# Patient Record
Sex: Female | Born: 1945 | Hispanic: Yes | Marital: Single | Smoking: Never smoker
Health system: Southern US, Community
[De-identification: ages and names within clinical notes are randomized; demographics above are authoritative.]

## PROBLEM LIST (undated history)

## (undated) DIAGNOSIS — I10 Essential (primary) hypertension: Secondary | ICD-10-CM

## (undated) DIAGNOSIS — E119 Type 2 diabetes mellitus without complications: Secondary | ICD-10-CM

---

## 1998-11-29 ENCOUNTER — Emergency Department (HOSPITAL_COMMUNITY): Admission: EM | Admit: 1998-11-29 | Discharge: 1998-11-29 | Payer: Self-pay | Admitting: *Deleted

## 1999-02-10 ENCOUNTER — Emergency Department (HOSPITAL_COMMUNITY): Admission: EM | Admit: 1999-02-10 | Discharge: 1999-02-10 | Payer: Self-pay | Admitting: Emergency Medicine

## 2003-12-18 ENCOUNTER — Emergency Department (HOSPITAL_COMMUNITY): Admission: EM | Admit: 2003-12-18 | Discharge: 2003-12-19 | Payer: Self-pay | Admitting: Emergency Medicine

## 2018-02-21 ENCOUNTER — Encounter (HOSPITAL_COMMUNITY): Payer: Self-pay | Admitting: Emergency Medicine

## 2018-02-21 ENCOUNTER — Emergency Department (HOSPITAL_COMMUNITY)
Admission: EM | Admit: 2018-02-21 | Discharge: 2018-02-22 | Disposition: A | Discharge status: Home / Self Care | Payer: Self-pay | Attending: Emergency Medicine | Admitting: Emergency Medicine

## 2018-02-21 ENCOUNTER — Other Ambulatory Visit: Payer: Self-pay

## 2018-02-21 ENCOUNTER — Emergency Department (HOSPITAL_COMMUNITY): Payer: Self-pay

## 2018-02-21 DIAGNOSIS — E119 Type 2 diabetes mellitus without complications: Secondary | ICD-10-CM | POA: Insufficient documentation

## 2018-02-21 DIAGNOSIS — R109 Unspecified abdominal pain: Secondary | ICD-10-CM | POA: Insufficient documentation

## 2018-02-21 DIAGNOSIS — I10 Essential (primary) hypertension: Secondary | ICD-10-CM | POA: Insufficient documentation

## 2018-02-21 DIAGNOSIS — T50905A Adverse effect of unspecified drugs, medicaments and biological substances, initial encounter: Secondary | ICD-10-CM | POA: Insufficient documentation

## 2018-02-21 DIAGNOSIS — E875 Hyperkalemia: Secondary | ICD-10-CM | POA: Insufficient documentation

## 2018-02-21 HISTORY — DX: Essential (primary) hypertension: I10

## 2018-02-21 HISTORY — DX: Type 2 diabetes mellitus without complications: E11.9

## 2018-02-21 LAB — URINALYSIS, ROUTINE W REFLEX MICROSCOPIC
Bilirubin Urine: NEGATIVE
Glucose, UA: >=500 mg/dL — AB
Hgb urine dipstick: NEGATIVE
Ketones, ur: NEGATIVE mg/dL
Leukocytes, UA: NEGATIVE
Nitrite: NEGATIVE
Protein, ur: NEGATIVE mg/dL
Specific Gravity, Urine: 1.024 (ref 1.005–1.030)
pH: 5 (ref 5.0–8.0)

## 2018-02-21 LAB — CBC WITH DIFFERENTIAL/PLATELET
Abs Immature Granulocytes: 0 10*3/uL (ref 0.0–0.1)
Basophils Absolute: 0.1 10*3/uL (ref 0.0–0.1)
Basophils Relative: 1 %
Eosinophils Absolute: 0.2 10*3/uL (ref 0.0–0.7)
Eosinophils Relative: 2 %
HCT: 39.5 % (ref 36.0–46.0)
Hemoglobin: 11.9 g/dL — ABNORMAL LOW (ref 12.0–15.0)
Immature Granulocytes: 0 %
Lymphocytes Relative: 31 %
Lymphs Abs: 2.9 10*3/uL (ref 0.7–4.0)
MCH: 28.6 pg (ref 26.0–34.0)
MCHC: 30.1 g/dL (ref 30.0–36.0)
MCV: 95 fL (ref 78.0–100.0)
Monocytes Absolute: 0.6 10*3/uL (ref 0.1–1.0)
Monocytes Relative: 7 %
Neutro Abs: 5.4 10*3/uL (ref 1.7–7.7)
Neutrophils Relative %: 59 %
Platelets: 237 10*3/uL (ref 150–400)
RBC: 4.16 MIL/uL (ref 3.87–5.11)
RDW: 15.2 % (ref 11.5–15.5)
WBC: 9.1 10*3/uL (ref 4.0–10.5)

## 2018-02-21 LAB — COMPREHENSIVE METABOLIC PANEL
ALT: 17 U/L (ref 0–44)
AST: 21 U/L (ref 15–41)
Albumin: 3.4 g/dL — ABNORMAL LOW (ref 3.5–5.0)
Alkaline Phosphatase: 90 U/L (ref 38–126)
Anion gap: 10 (ref 5–15)
BUN: 41 mg/dL — ABNORMAL HIGH (ref 8–23)
CO2: 26 mmol/L (ref 22–32)
Calcium: 9.1 mg/dL (ref 8.9–10.3)
Chloride: 105 mmol/L (ref 98–111)
Creatinine, Ser: 1.25 mg/dL — ABNORMAL HIGH (ref 0.44–1.00)
GFR calc Af Amer: 50 mL/min — ABNORMAL LOW (ref >60)
GFR calc non Af Amer: 43 mL/min — ABNORMAL LOW (ref >60)
Glucose, Bld: 107 mg/dL — ABNORMAL HIGH (ref 70–99)
Potassium: 6 mmol/L — ABNORMAL HIGH (ref 3.5–5.1)
Sodium: 141 mmol/L (ref 135–145)
Total Bilirubin: 0.6 mg/dL (ref 0.3–1.2)
Total Protein: 7.6 g/dL (ref 6.5–8.1)

## 2018-02-21 LAB — CBG MONITORING, ED: Glucose-Capillary: 114 mg/dL — ABNORMAL HIGH (ref 70–99)

## 2018-02-21 LAB — POTASSIUM: POTASSIUM: 5.5 mmol/L — AB (ref 3.5–5.1)

## 2018-02-21 MED ORDER — AMLODIPINE BESYLATE 5 MG PO TABS: 5.0000 mg | ORAL_TABLET | Freq: Every day | ORAL | 0 refills | Status: AC

## 2018-02-21 NOTE — ED Provider Notes (Signed)
Medical screening examination/treatment/procedure(s) were conducted as a shared visit with non-physician practitioner(s) and myself.  I personally evaluated the patient during the encounter.  None 72 year old female presents with with worsening left-sided flank pain which is been intermittent.  Does have history of hypertension diabetes.  Urinalysis negative for infection here.  Does have some mild hyperkalemia with potassium of 6 and this will be rechecked.  Suspect this may be medication induced.  She has a history of renal insufficiency as well.  Awaiting lab results   Lorre NickAllen, Felicia Pat, MD 02/21/18 2212

## 2018-02-21 NOTE — ED Provider Notes (Signed)
[facility] Provider Note   CSN: [id number] Arrival date & time: 02/21/18  1631     History   Chief Complaint Chief Complaint  Patient presents with  . Flank Pain    HPI Felicia Delacruz is a 72 y.o. female.  72 year old Hispanic female reporting left flank pain that started three days ago. Pain originates in the left flank and occasionally radiates to the LUQ and LLQ. She states that she feels like she needs to have a bowel movement, but does not defecate. She denies fever or urinary symptoms, but endorses chills. Non-productive cough. She was seen by a provider in [place] about 6 months ago who treated her for renal insufficiency.   Abdominal Pain   This is a new problem. The current episode started more than 2 days ago. The problem has been gradually worsening. Pain location: left flank. The quality of the pain is cramping and colicky. The pain is moderate. Associated symptoms include constipation. Pertinent negatives include fever, dysuria, frequency and hematuria.    Past Medical History:  Diagnosis Date  . Diabetes mellitus without complication (HCC)   . Hypertension     There are no active problems to display for this patient.   History reviewed. No pertinent surgical history.   OB History   None      Home Medications    Prior to Admission medications   Not on File    Family History No family history on file.  Social History Social History   Tobacco Use  . Smoking status: Never Smoker  . Smokeless tobacco: Never Used  Substance Use Topics  . Alcohol use: Never    Frequency: Never  . Drug use: Never     Allergies   Bactrim [sulfamethoxazole-trimethoprim]   Review of Systems Review of Systems  Constitutional: Negative for fever.  Gastrointestinal: Positive for abdominal pain and constipation.  Genitourinary: Negative for dysuria, frequency and hematuria.  All other systems reviewed and are  negative.    Physical Exam Updated Vital Signs BP 136/62 (BP Location: Right Arm)   Pulse 81   Temp 98.7 F (37.1 C) (Oral)   Resp 16   Ht 5\' 4"  (1.626 m)   Wt 96.6 kg   SpO2 100%   BMI 36.56 kg/m   Physical Exam  Constitutional: She is oriented to person, place, and time. She appears well-developed and well-nourished.  HENT:  Head: Atraumatic.  Eyes: Conjunctivae are normal.  Neck: Neck supple.  Cardiovascular: Normal rate and regular rhythm.  Pulmonary/Chest: Effort normal and breath sounds normal.  Abdominal: Soft. There is tenderness.  Musculoskeletal: Normal range of motion.  Neurological: She is alert and oriented to person, place, and time.  Skin: Skin is warm and dry.  Psychiatric: She has a normal mood and affect.  Nursing note and vitals reviewed.    ED Treatments / Results  Labs (all labs ordered are listed, but only abnormal results are displayed) Labs Reviewed  URINALYSIS, ROUTINE W REFLEX MICROSCOPIC - Abnormal; Notable for the following components:      Result Value   APPearance CLOUDY (*)    Glucose, UA >=500 (*)    Bacteria, UA RARE (*)    All other components within normal limits  CBG MONITORING, ED - Abnormal; Notable for the following components:   Glucose-Capillary 114 (*)    All other components within normal limits    EKG None  Radiology Ct Renal Stone Study  Result Date: 02/21/2018  CLINICAL DATA:  7 out of 10 left flank pain EXAM: CT ABDOMEN AND PELVIS WITHOUT CONTRAST TECHNIQUE: Multidetector CT imaging of the abdomen and pelvis was performed following the standard protocol without IV contrast. COMPARISON:  None FINDINGS: Lower chest: Top-normal size heart. Lung bases are clear. Small granuloma is seen at the left lung base. Hepatobiliary: Gallbladder is not visualized and appears to be surgically absent. The unenhanced liver is unremarkable. Pancreas: Normal without ductal dilatation or mass. Spleen: Normal size spleen without mass.  Adrenals/Urinary Tract: Normal bilateral adrenal glands. Nonspecific 15 mm lesion arising off the interpolar left kidney possibly proteinaceous or hemorrhagic cyst. Further characterization is not possible in light of a noncontrast study. This could be further evaluated with a renal ultrasound or IV contrast enhanced CT or MRI. Comparison with outside studies documenting stability may obviate the need for additional imaging. Small nonspecific subcapsular foci of fat noted about the right kidney. No hydroureteronephrosis. The urinary bladder is unremarkable. Stomach/Bowel: Small hiatal hernia. The stomach and small intestine are unremarkable. Increased fecal residue throughout the colon. The appendix is normal in caliber and without inflammatory change. Vascular/Lymphatic: Aortoiliac atherosclerosis without aneurysm. Lymphadenopathy by CT size criteria. Reproductive: No acute abnormality. Other: Injection granulomata overlie gluteal muscles bilaterally. Musculoskeletal: Degenerative disc disease L5-S1. No acute nor suspicious osseous abnormalities. IMPRESSION: 1. Nonspecific exophytic lesion measuring 15 mm off the interpolar left kidney, slightly hyperdense and not a simple cyst. Further characterization could be performed with renal ultrasound, IV contrast head CT or MRI. 2. Increased fecal retention within the colon without bowel obstruction or inflammation. 3. Degenerative disc disease L5 S. Electronically Signed   By: Tollie Eth M.D.   On: 02/21/2018 20:12    Procedures Procedures (including critical care time)  Medications Ordered in ED Medications - No data to display   Initial Impression / Assessment and Plan / ED Course  I have reviewed the triage vital signs and the nursing notes.  Pertinent labs & imaging results that were available during my care of the patient were reviewed by me and considered in my medical decision making (see chart for details).     Patient discussed with and seen by  Dr Freida Busman.  Patient presents with left flank pain. Non-contrast CT obtained. Results reviewed and shared with patient. Patient does not meet SIRS or sepsis criteria. She does not have a surgical abdomen or peritoneal signs on exam. Patient noted to have mild, likely chronic, renal insufficiency and elevated potassium of 5.5. Potassium elevation may be due to her anti-hypertensive medication (irbesartan). This medication will be discontinued, and patient started on norvasc . Patient does not have a local primary care provider, and will be discharged home with recommendation to follow-up at [facility]. Patient will be returning to her home in [place] on April 19, 2018.  I have discussed reasons to return immediately to the ER.  Patient expresses understanding and agrees with plan.      Final Clinical Impressions(s) / ED Diagnoses   Final diagnoses:  Left flank pain  Drug-induced hyperkalemia    ED Discharge Orders         Ordered    amLODipine (NORVASC) 5 MG tablet  Daily     02/21/18 2350           Felicie Morn, NP 02/22/18 0118    Lorre Nick, MD 02/22/18 1501

## 2018-02-21 NOTE — ED Notes (Signed)
Attempted blood draw. Phlebotomy will get blood

## 2018-02-21 NOTE — Discharge Instructions (Addendum)
Stop the ibresartan. Start the norvasc for your blood pressure. Please follow-up with [facility] to monitor your potassium level and your kidney functions.

## 2018-02-21 NOTE — ED Triage Notes (Signed)
Pt reports constant 7/10 left flank pain that started 3 days ago that has gotten worse today. Pt reports oliguria. Denies any injuries. Pt speaks Spanish.

## 2018-02-21 NOTE — ED Notes (Signed)
See EDP assessment 

## 2018-02-22 NOTE — ED Notes (Signed)
Pt verbalizes understanding of d/c instructions. Prescriptions reviewed with patient. Pt ambulatory at d/c with all belongings and with family.   

## 2018-03-20 ENCOUNTER — Inpatient Hospital Stay: Payer: Self-pay | Admitting: Family Medicine

## 2019-03-15 IMAGING — CT CT RENAL STONE PROTOCOL
2 of 4 series · 16 of 46 positions shown, 18 images · non-contrast
Comparison: None

CLINICAL DATA: [DATE] left flank pain

EXAM:
CT ABDOMEN AND PELVIS WITHOUT CONTRAST
TECHNIQUE: Multidetector CT imaging of the abdomen and pelvis was performed
following the standard protocol without IV contrast.

[Series 3: renal stone 5.0 · axial · 0.91mm/px · z∈[+746,+1166]mm · 13 of 93 slices shown, 15 images]
[im 5/93  soft-tissue]
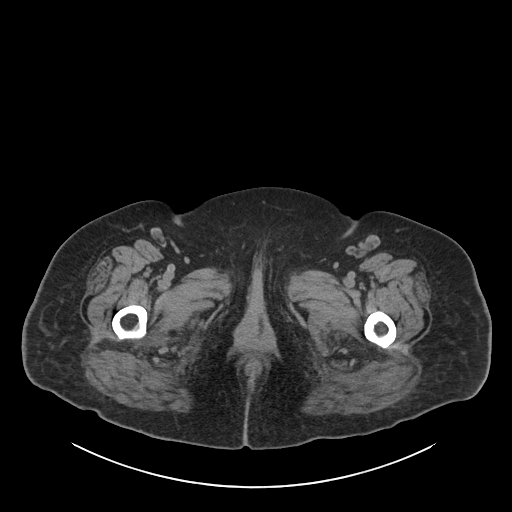
[im 5/93  bone]
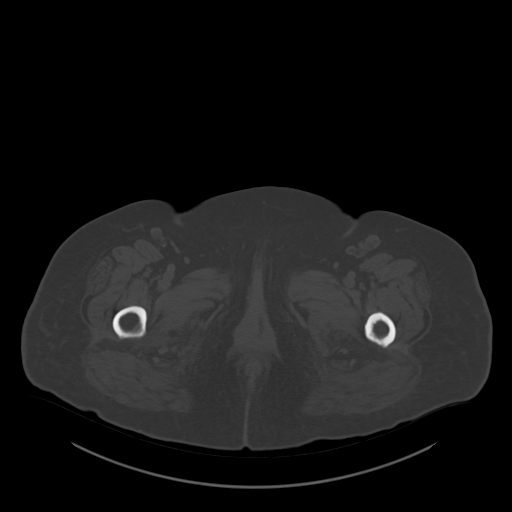
[im 13/93  soft-tissue]
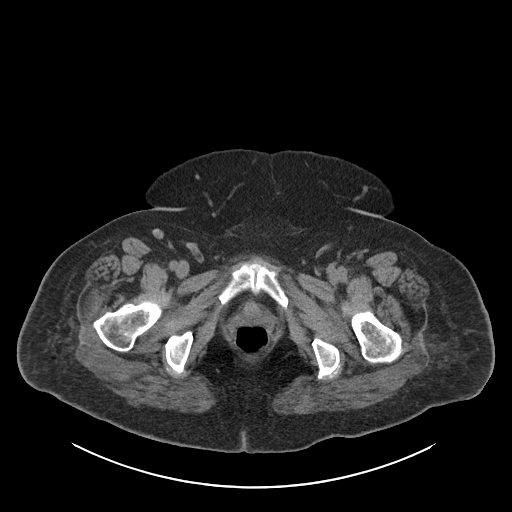
[im 21/93  soft-tissue]
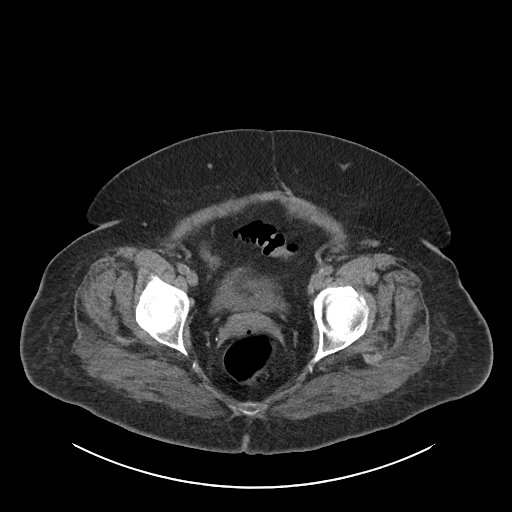
[im 25/93  soft-tissue]
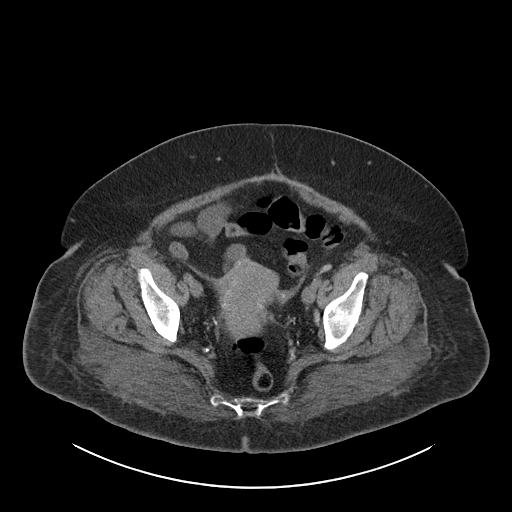
[im 33/93  soft-tissue]
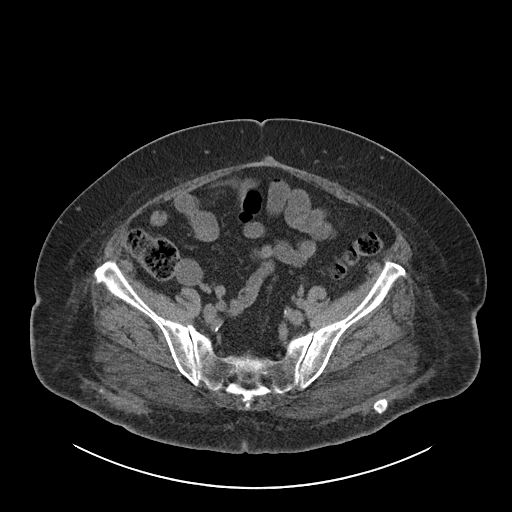
[im 41/93  soft-tissue]
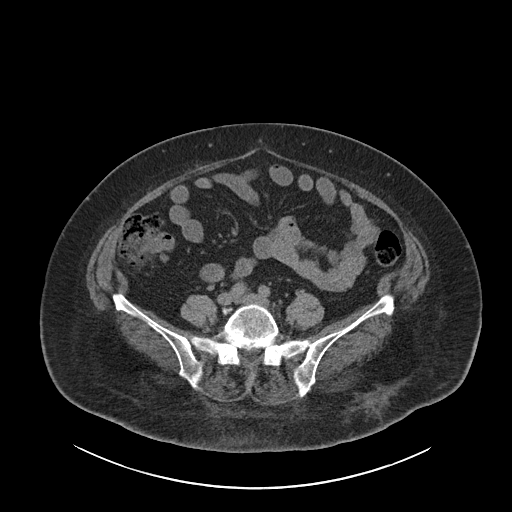
[im 49/93  soft-tissue]
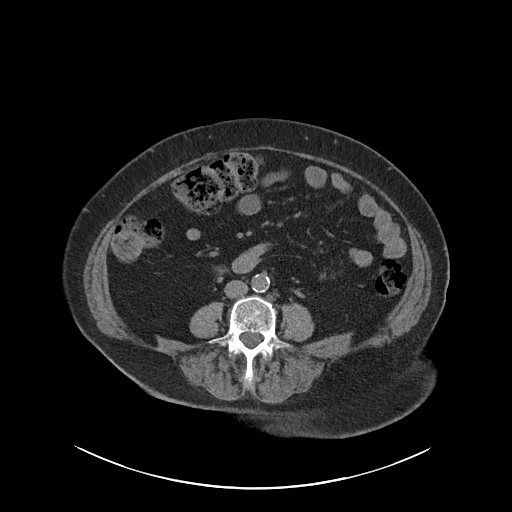
[im 53/93  soft-tissue]
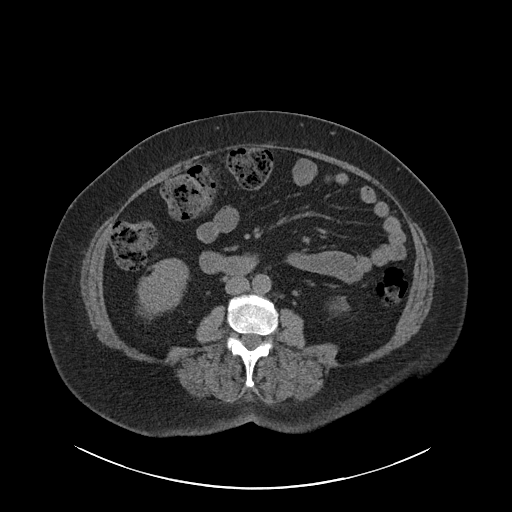
[im 61/93  soft-tissue]
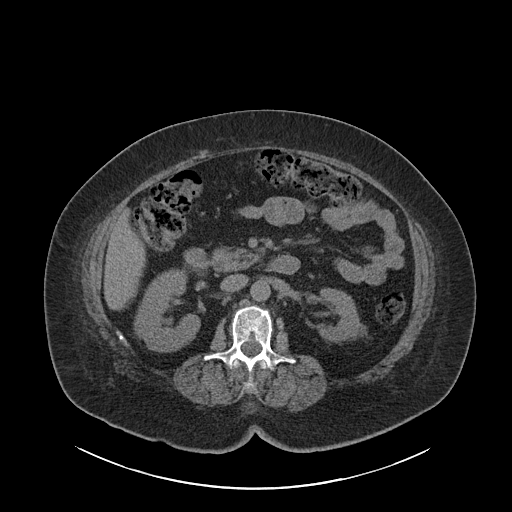
[im 61/93  bone]
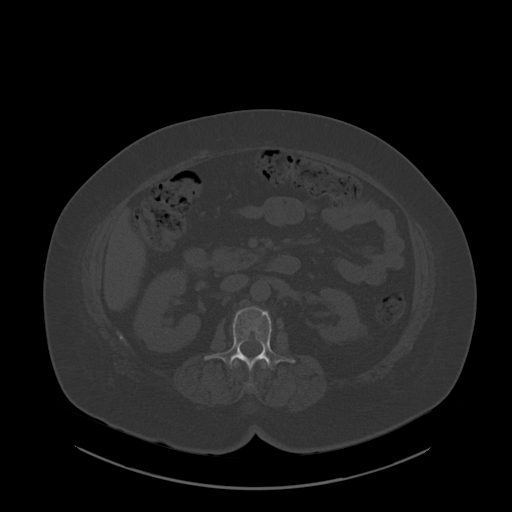
[im 69/93  soft-tissue]
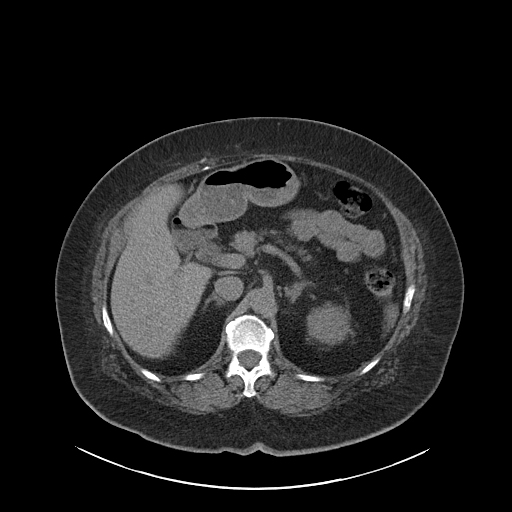
[im 73/93  soft-tissue]
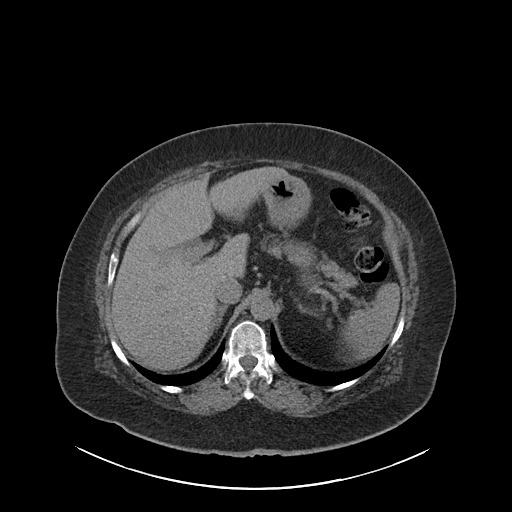
[im 81/93  soft-tissue]
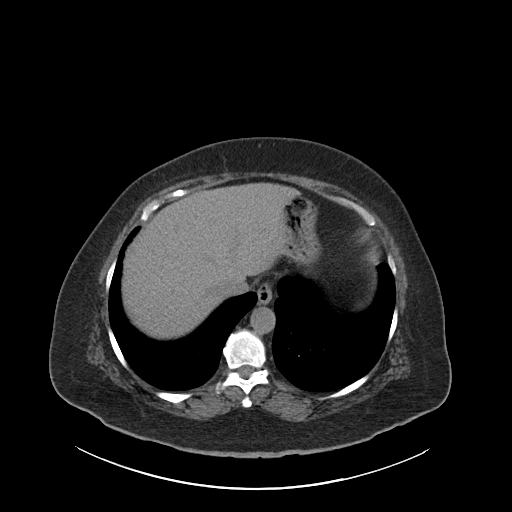
[im 89/93  soft-tissue]
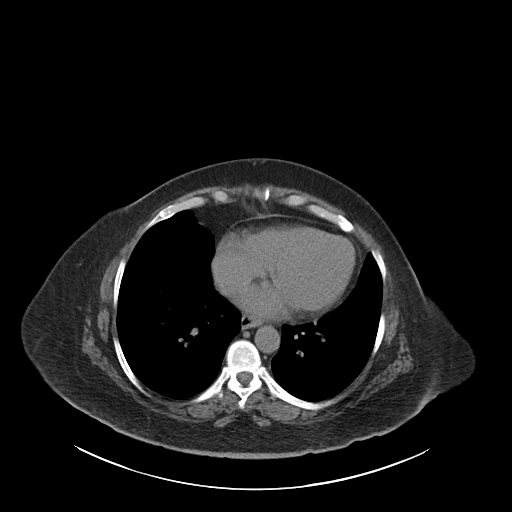

[Series 4: renal stone 3.0 cor · coronal · 0.89mm/px · 3 of 101 slices shown]
[im 34/101  soft-tissue]
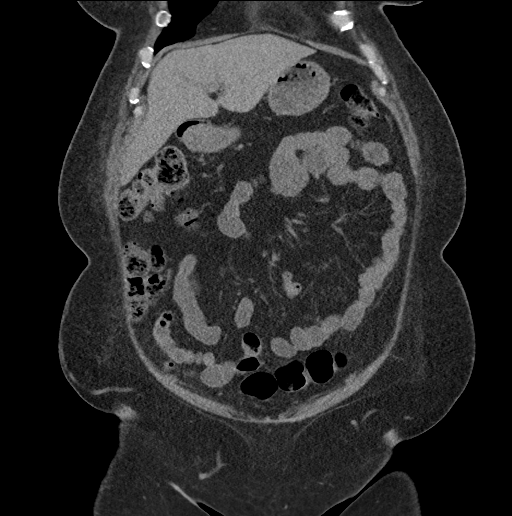
[im 45/101  soft-tissue]
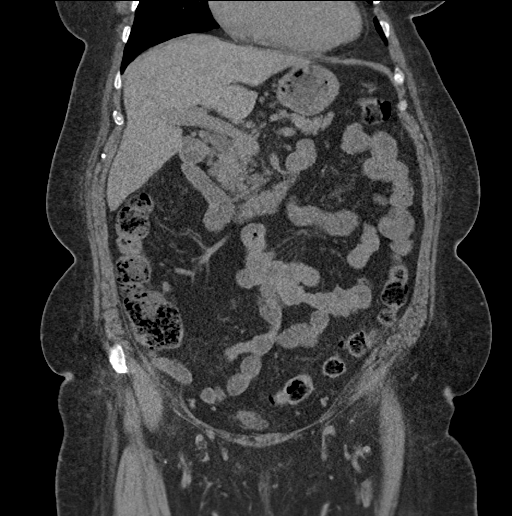
[im 56/101  soft-tissue]
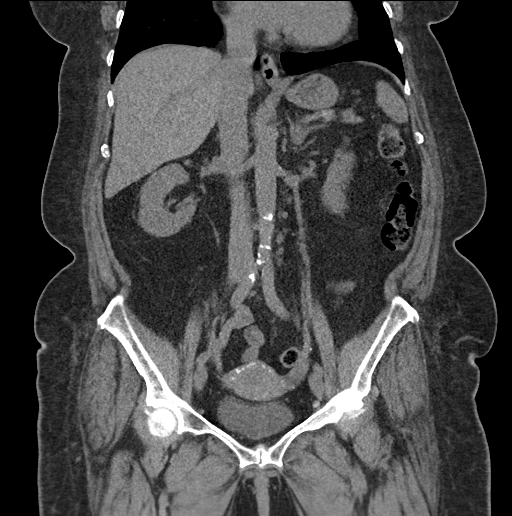

[16 of 46 positions shown; findings below may reference images not displayed]

FINDINGS: Lower chest: Top-normal size heart. Lung bases are clear. Small
granuloma is seen at the left lung base.

Hepatobiliary: Gallbladder is not visualized and appears to be
surgically absent. The unenhanced liver is unremarkable.

Pancreas: Normal without ductal dilatation or mass.

Spleen: Normal size spleen without mass.

Adrenals/Urinary Tract: Normal bilateral adrenal glands. Nonspecific
15 mm lesion arising off the interpolar left kidney possibly
proteinaceous or hemorrhagic cyst. Further characterization is not
possible in light of a noncontrast study. This could be further
evaluated with a renal ultrasound or IV contrast enhanced CT or MRI.
Comparison with outside studies documenting stability may obviate
the need for additional imaging. Small nonspecific subcapsular foci
of fat noted about the right kidney. No hydroureteronephrosis. The
urinary bladder is unremarkable.

Stomach/Bowel: Small hiatal hernia. The stomach and small intestine
are unremarkable. Increased fecal residue throughout the colon. The
appendix is normal in caliber and without inflammatory change.

Vascular/Lymphatic: Aortoiliac atherosclerosis without aneurysm.
Lymphadenopathy by CT size criteria.

Reproductive: No acute abnormality.

Other: Injection granulomata overlie gluteal muscles bilaterally.

Musculoskeletal: Degenerative disc disease L5-S1. No acute nor
suspicious osseous abnormalities.
IMPRESSION: 1. Nonspecific exophytic lesion measuring 15 mm off the interpolar
left kidney, slightly hyperdense and not a simple cyst. Further
characterization could be performed with renal ultrasound, IV
contrast head CT or MRI.
2. Increased fecal retention within the colon without bowel
obstruction or inflammation.
3. Degenerative disc disease L5 S.
